# Patient Record
Sex: Female | Born: 1960 | Race: Black or African American | Hispanic: No | Marital: Single | Smoking: Never smoker
Health system: Southern US, Community
[De-identification: ages and names within clinical notes are randomized; demographics above are authoritative.]

## PROBLEM LIST (undated history)

## (undated) HISTORY — PX: ABDOMINAL HYSTERECTOMY: SHX81

## (undated) HISTORY — PX: BACK SURGERY: SHX140

## (undated) HISTORY — PX: ROTATOR CUFF REPAIR: SHX139

---

## 2015-10-10 ENCOUNTER — Emergency Department (HOSPITAL_COMMUNITY): Payer: Medicare Other

## 2015-10-10 ENCOUNTER — Emergency Department (HOSPITAL_COMMUNITY)
Admission: EM | Admit: 2015-10-10 | Discharge: 2015-10-10 | Disposition: A | Discharge status: Home / Self Care | Payer: Medicare Other | Attending: Emergency Medicine | Admitting: Emergency Medicine

## 2015-10-10 ENCOUNTER — Encounter (HOSPITAL_COMMUNITY): Payer: Self-pay | Admitting: Vascular Surgery

## 2015-10-10 DIAGNOSIS — S9031XA Contusion of right foot, initial encounter: Secondary | ICD-10-CM | POA: Insufficient documentation

## 2015-10-10 DIAGNOSIS — Y939 Activity, unspecified: Secondary | ICD-10-CM | POA: Insufficient documentation

## 2015-10-10 DIAGNOSIS — J069 Acute upper respiratory infection, unspecified: Secondary | ICD-10-CM | POA: Diagnosis not present

## 2015-10-10 DIAGNOSIS — X58XXXA Exposure to other specified factors, initial encounter: Secondary | ICD-10-CM | POA: Diagnosis not present

## 2015-10-10 DIAGNOSIS — S99921A Unspecified injury of right foot, initial encounter: Secondary | ICD-10-CM | POA: Diagnosis present

## 2015-10-10 DIAGNOSIS — Y929 Unspecified place or not applicable: Secondary | ICD-10-CM | POA: Diagnosis not present

## 2015-10-10 DIAGNOSIS — Y999 Unspecified external cause status: Secondary | ICD-10-CM | POA: Diagnosis not present

## 2015-10-10 LAB — RAPID STREP SCREEN (MED CTR MEBANE ONLY): Streptococcus, Group A Screen (Direct): NEGATIVE

## 2015-10-10 MED ORDER — ACETAMINOPHEN 325 MG PO TABS
ORAL_TABLET | ORAL | Status: AC
  Filled 2015-10-10: qty 2

## 2015-10-10 MED ORDER — HYDROCODONE-ACETAMINOPHEN 5-325 MG PO TABS: ORAL_TABLET | ORAL | 0 refills | Status: AC

## 2015-10-10 MED ORDER — HYDROCODONE-ACETAMINOPHEN 5-325 MG PO TABS
1.0000 | ORAL_TABLET | Freq: Once | ORAL | Status: AC
  Administered 2015-10-10: 1 via ORAL
  Filled 2015-10-10: qty 1

## 2015-10-10 MED ORDER — ACETAMINOPHEN 325 MG PO TABS: 650.0000 mg | ORAL_TABLET | Freq: Once | ORAL | Status: DC

## 2015-10-10 MED ORDER — ACETAMINOPHEN 325 MG PO TABS
650.0000 mg | ORAL_TABLET | Freq: Once | ORAL | Status: AC | PRN
Start: 2015-10-10 — End: 2015-10-10
  Administered 2015-10-10: 650 mg via ORAL

## 2015-10-10 NOTE — ED Provider Notes (Signed)
[facility]-EMERGENCY DEPT Provider Note   CSN: [id number] Arrival date & time: 10/10/15  1948  By signing my name below, I, Clovis PuAvnee Patel, attest that this documentation has been prepared under the direction and in the presence of  [employer] Hayato Guaman, PA-C. Electronically Signed: Clovis PuAvnee Patel, ED Scribe. 10/10/15. 9:48 PM.    History   Chief Complaint Chief Complaint  Patient presents with  . Sore Throat  . Cough  . Otalgia     The history is provided by the patient. No language interpreter was used.    HPI Comments:  Abigail Graves is a 55 y.o. female who presents to the Emergency Department complaining of worsening, moderate throat pain onset 1.5 weeks ago. Pt notes an associated dry cough, chest pain, and SOB when trying to talk. She notes associated rhinorrhea which began today. Pt has taken Claritin, Robitussin, and Nyquil with little relief. Pt has had no sick contact. Pt states her cousin, who is a doctor, observed her and prescribed her Prednisone and Azithromycin. She states she began feeling nauseous after taking the medications. She denies sick contact, tick bites, and has not been taking estrogen. Pt denies having a history of blood clots or lung problems.    History reviewed. No pertinent past medical history.  There are no active problems to display for this patient.   Past Surgical History:  Procedure Laterality Date  . ABDOMINAL HYSTERECTOMY    . BACK SURGERY    . CESAREAN SECTION    . ROTATOR CUFF REPAIR Bilateral     OB History    No data available       Home Medications    Prior to Admission medications   Not on File    Family History No family history on file.  Social History Social History  Substance Use Topics  . Smoking status: Never Smoker  . Smokeless tobacco: Never Used  . Alcohol use Yes     Comment: occasionally     Allergies   Review of patient's allergies indicates not on file.   Review of Systems Review of Systems 10 systems  reviewed and all are negative for acute change except as noted in the HPI.    Physical Exam Updated Vital Signs BP 147/83 (BP Location: Right Arm)   Pulse 103   Temp 99.4 F (37.4 C) (Oral)   Resp 18   Ht 5\' 5"  (1.651 m)   Wt 157 lb (71.2 kg)   SpO2 99%   BMI 26.13 kg/m   Physical Exam  Constitutional: She appears well-developed and well-nourished.  HENT:  Head: Normocephalic.  Right Ear: External ear normal.  Left Ear: External ear normal.  Mouth/Throat: Oropharynx is clear and moist. No oropharyngeal exudate.  No drooling or stridor. Posterior pharynx mildly erythematous no significant tonsillar hypertrophy. No exudate. Soft palate rises symmetrically. No TTP or induration under tongue.   No tenderness to palpation of frontal or bilateral maxillary sinuses.  Mild mucosal edema in the nares with scant rhinorrhea.  Bilateral tympanic membranes with normal architecture and good light reflex.    Eyes: Conjunctivae and EOM are normal. Pupils are equal, round, and reactive to light.  Neck: Normal range of motion. Neck supple.  Cardiovascular: Normal rate and regular rhythm.   Pulmonary/Chest: Effort normal and breath sounds normal. No stridor. No respiratory distress. She has no wheezes. She has no rales. She exhibits no tenderness.  Abdominal: Soft. There is no tenderness. There is no rebound and no guarding.  Nursing note and  vitals reviewed.    ED Treatments / Results  DIAGNOSTIC STUDIES:  Oxygen Saturation is 99% on RA, normal by my interpretation.    COORDINATION OF CARE:  9:33 PM Discussed treatment plan with pt at bedside and pt agreed to plan.  Labs (all labs ordered are listed, but only abnormal results are displayed) Labs Reviewed  RAPID STREP SCREEN (NOT AT [facility])  CULTURE, GROUP A STREP [facility])    EKG  EKG Interpretation None       Radiology No results found.  Procedures Procedures (including critical care time)  Medications Ordered in  ED Medications  acetaminophen (TYLENOL) 325 MG tablet (not administered)  acetaminophen (TYLENOL) tablet 650 mg (650 mg Oral Not Given 10/10/15 2245)  acetaminophen (TYLENOL) tablet 650 mg (650 mg Oral Given 10/10/15 2002)  HYDROcodone-acetaminophen (NORCO/VICODIN) 5-325 MG per tablet 1 tablet (1 tablet Oral Given 10/10/15 2259)     Initial Impression / Assessment and Plan / ED Course  I have reviewed the triage vital signs and the nursing notes.  Pertinent labs & imaging results that were available during my care of the patient were reviewed by me and considered in my medical decision making (see chart for details).  Clinical Course    Vitals:   10/10/15 1951 10/10/15 1952 10/10/15 2245  BP:  147/83 126/73  Pulse:  103 94  Resp:  18 20  Temp: 99.4 F (37.4 C) 99.4 F (37.4 C) 98.7 F (37.1 C)  TempSrc: Oral Oral Oral  SpO2:  99% 100%  Weight:  71.2 kg   Height:  5\' 5"  (1.651 m)     Medications  acetaminophen (TYLENOL) 325 MG tablet (not administered)  acetaminophen (TYLENOL) tablet 650 mg (650 mg Oral Not Given 10/10/15 2245)  acetaminophen (TYLENOL) tablet 650 mg (650 mg Oral Given 10/10/15 2002)  HYDROcodone-acetaminophen (NORCO/VICODIN) 5-325 MG per tablet 1 tablet (1 tablet Oral Given 10/10/15 2259)    Abigail Graves is 55 y.o. female presenting with Sore throat, dry cough and otalgia. No signs of systemic infection. Strep and chest x-ray negative, lung sounds clear to auscultation, likely viral URI. Patient is being treated with prednisone and azithromycin. Advised her to continue this and will add on Vicodin for cough suppression at night so she can sleep.  Evaluation does not show pathology that would require ongoing emergent intervention or inpatient treatment. Pt is hemodynamically stable and mentating appropriately. Discussed findings and plan with patient/guardian, who agrees with care plan. All questions answered. Return precautions discussed and outpatient follow up  given.      Final Clinical Impressions(s) / ED Diagnoses   Final diagnoses:  Foot contusion, right, initial encounter  URI (upper respiratory infection)    New Prescriptions Discharge Medication List as of 10/10/2015 10:46 PM    START taking these medications   Details  HYDROcodone-acetaminophen (NORCO/VICODIN) 5-325 MG tablet Take 1-2 tablets by mouth every 6 hours as needed for pain and/or cough., Print       I personally performed the services described in this documentation, which was scribed in my presence. The recorded information has been reviewed and is accurate.    Wynetta Emery, PA-C 10/10/15 2346    Cathren Laine, MD 10/10/15 [phone]

## 2015-10-10 NOTE — ED Triage Notes (Signed)
Pt reports to the ED for eval of dry cough, sore throat, post-nasal drip (clear), nasal congestion, and SOB. Pt reports she has been having these symptoms x 1.5. Pt has tried Robitussin, Claritin, and Nyquil without relief. She was prescribed Prednisone and Azithromycin. Pt took her first dose just a few hours ago but states no relief.

## 2015-10-10 NOTE — Discharge Instructions (Signed)

## 2015-10-13 LAB — CULTURE, GROUP A STREP (THRC)

## 2018-01-01 IMAGING — DX DG CHEST 2V
2 series · 2 of 2 positions shown · non-contrast
Comparison: None.

CLINICAL DATA: Cough, congestion, shortness of breath, symptoms for
1 day.

EXAM:
CHEST  2 VIEW

[chest pa]
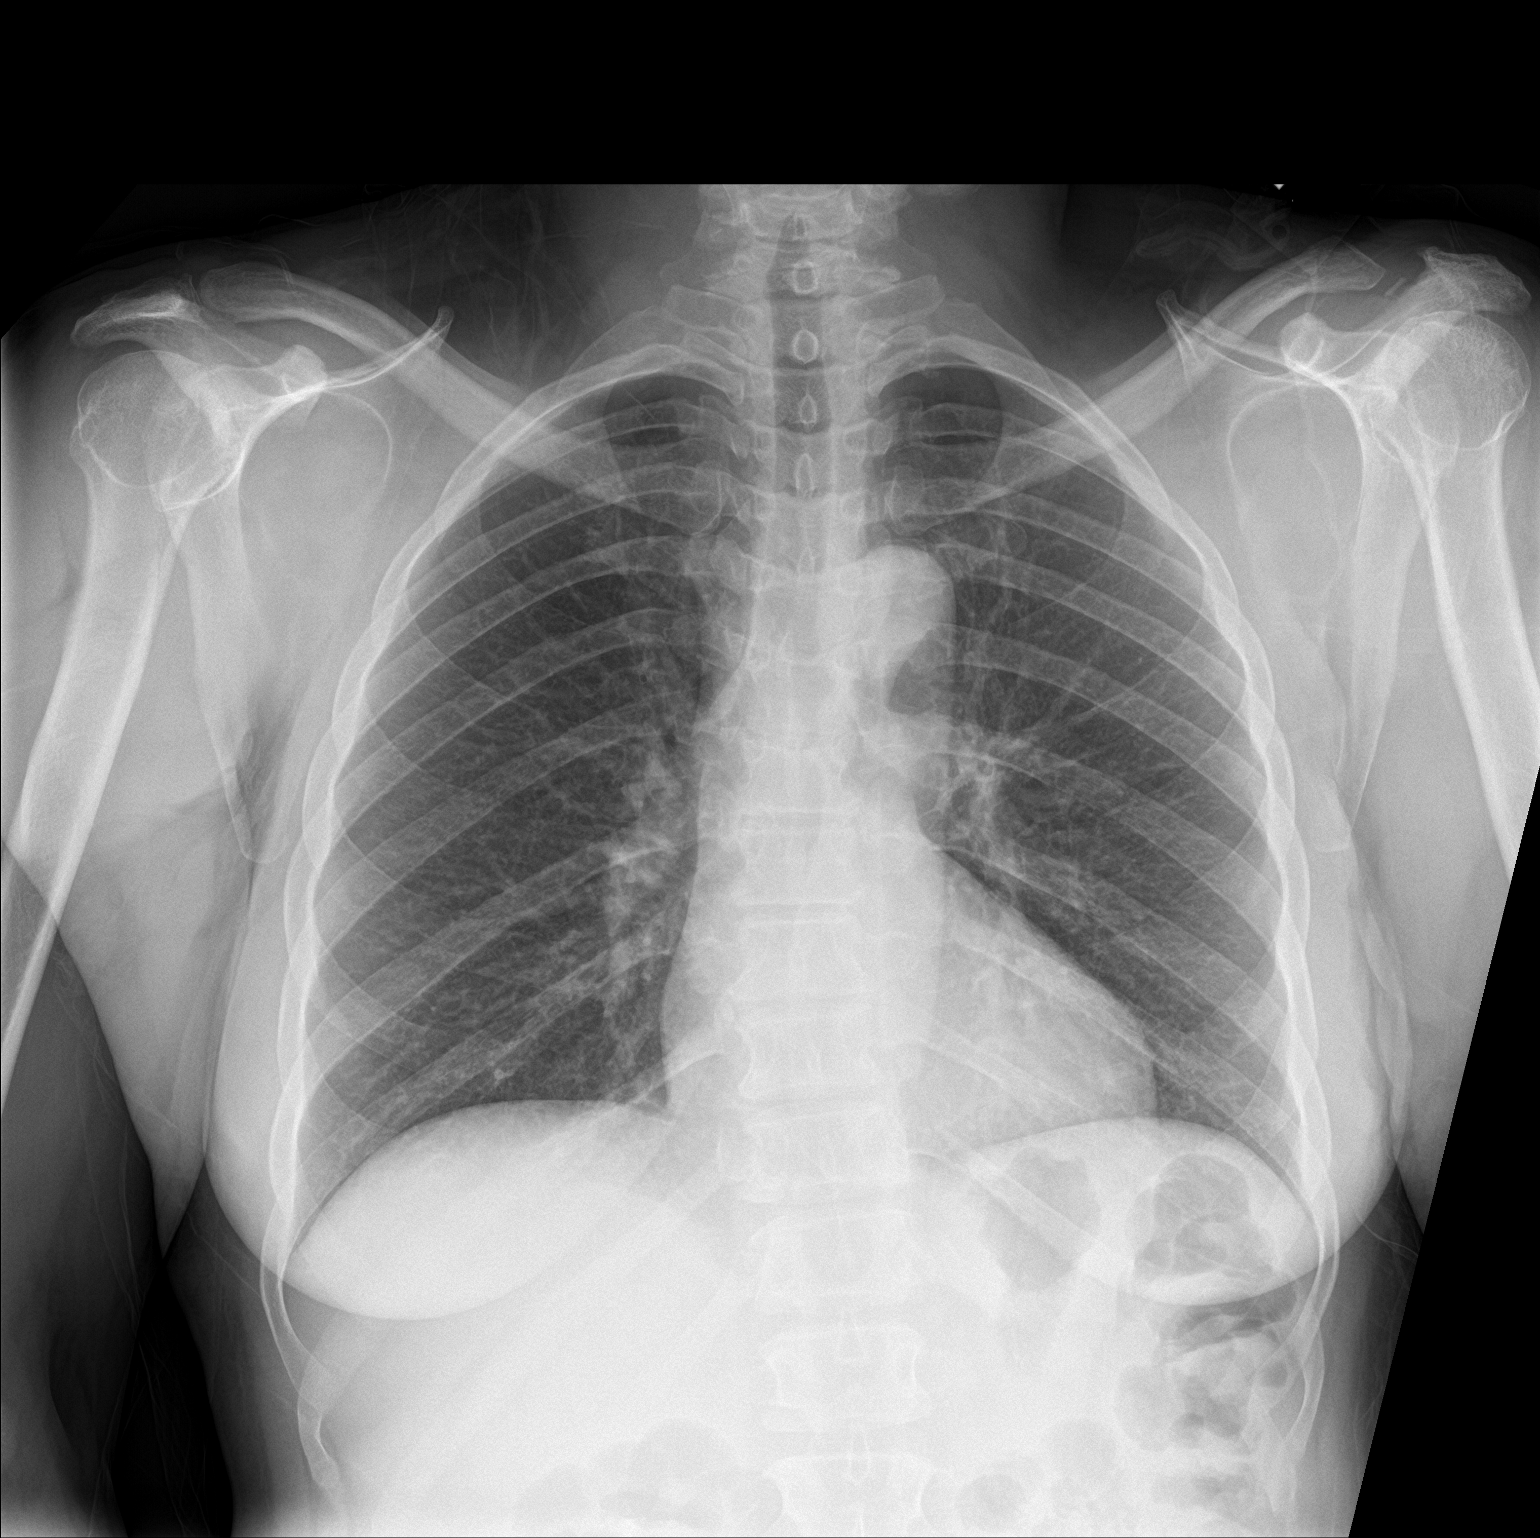

[chest lat]
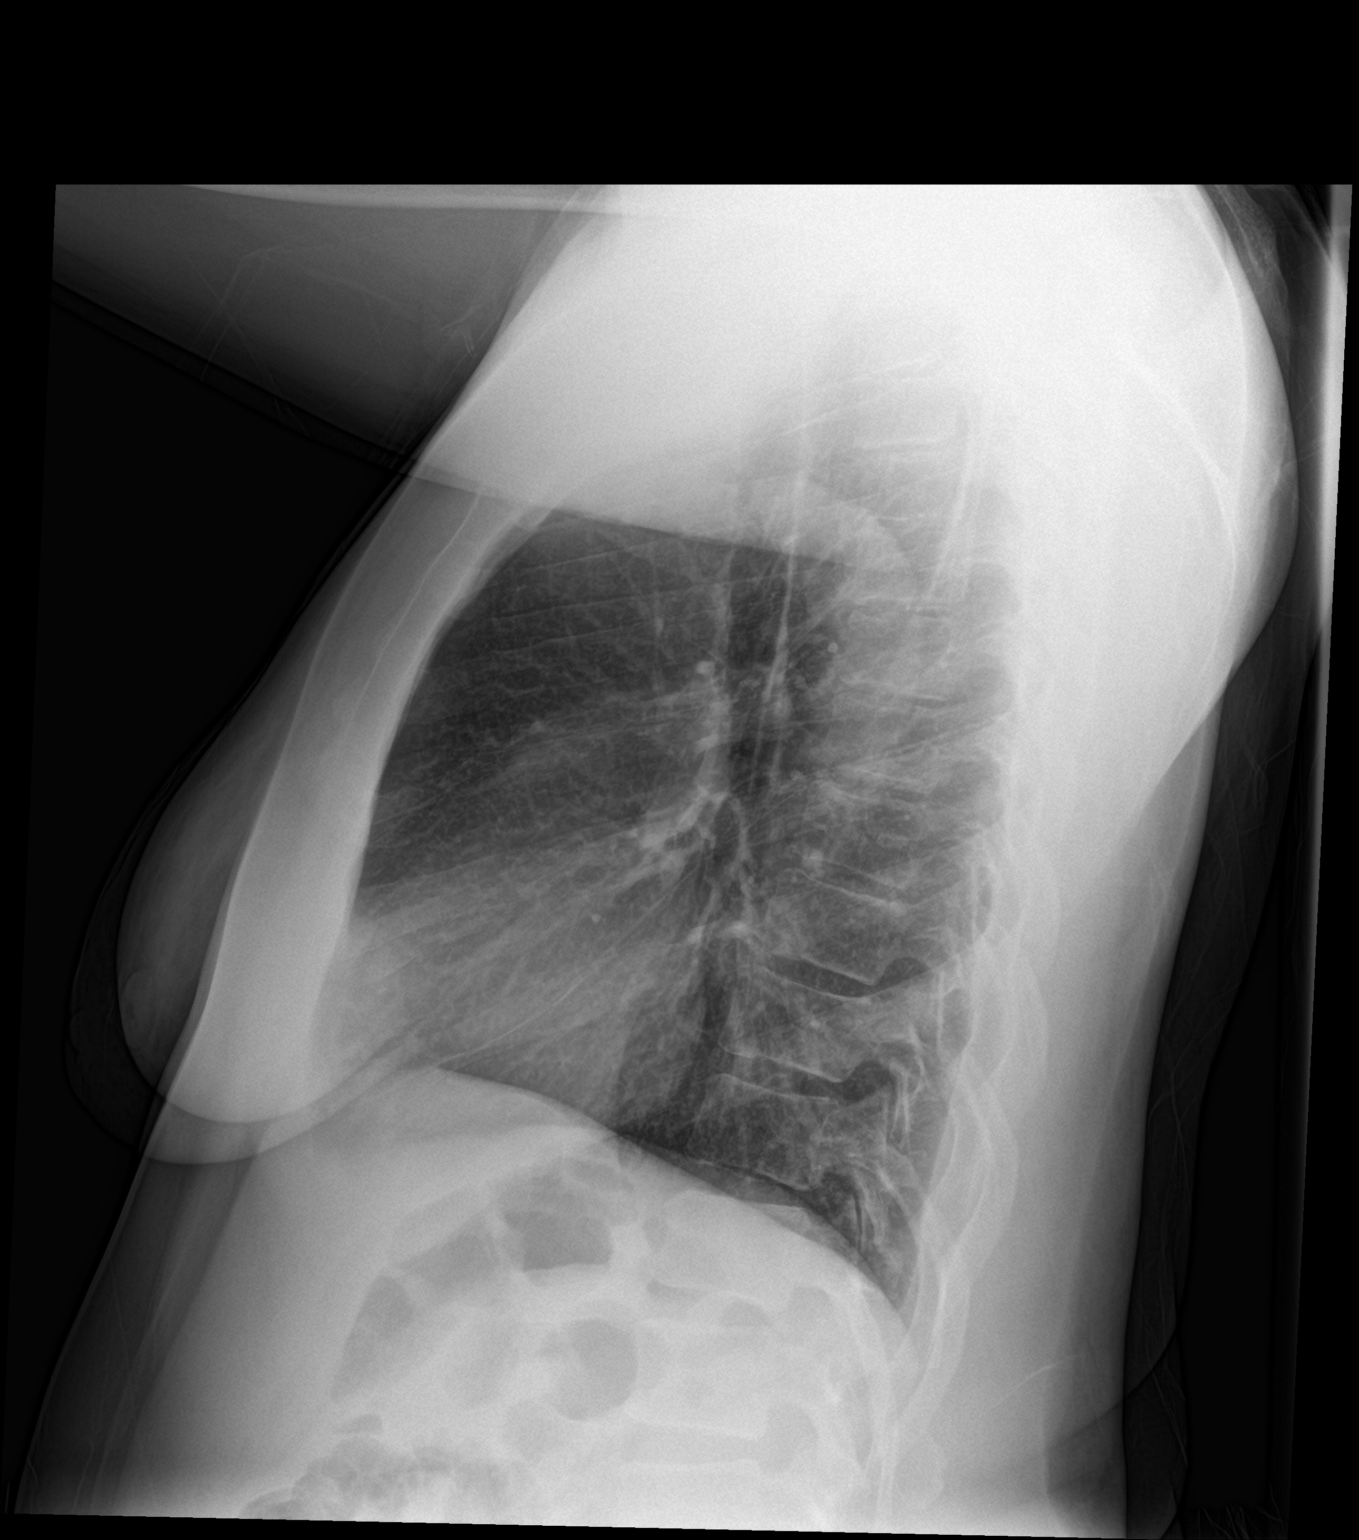

[2 of 2 positions shown; findings below may reference images not displayed]

FINDINGS: The cardiomediastinal contours are normal. The lungs are clear.
Pulmonary vasculature is normal. No consolidation, pleural effusion,
or pneumothorax. No acute osseous abnormalities are seen. Widening
of the left acromioclavicular joint appearspostsurgical.
IMPRESSION: No active cardiopulmonary disease.
